# Patient Record
Sex: Female | Born: 1980 | Race: Black or African American | Hispanic: No | Marital: Married | Smoking: Never smoker
Health system: Southern US, Community
[De-identification: ages and names within clinical notes are randomized; demographics above are authoritative.]

## PROBLEM LIST (undated history)

## (undated) DIAGNOSIS — D219 Benign neoplasm of connective and other soft tissue, unspecified: Secondary | ICD-10-CM

## (undated) DIAGNOSIS — T7840XA Allergy, unspecified, initial encounter: Secondary | ICD-10-CM

## (undated) DIAGNOSIS — B019 Varicella without complication: Secondary | ICD-10-CM

## (undated) DIAGNOSIS — E785 Hyperlipidemia, unspecified: Secondary | ICD-10-CM

## (undated) DIAGNOSIS — G44229 Chronic tension-type headache, not intractable: Secondary | ICD-10-CM

## (undated) DIAGNOSIS — L732 Hidradenitis suppurativa: Secondary | ICD-10-CM

## (undated) HISTORY — DX: Allergy, unspecified, initial encounter: T78.40XA

## (undated) HISTORY — PX: OTHER SURGICAL HISTORY: SHX169

## (undated) HISTORY — DX: Hyperlipidemia, unspecified: E78.5

## (undated) HISTORY — DX: Hidradenitis suppurativa: L73.2

## (undated) HISTORY — DX: Chronic tension-type headache, not intractable: G44.229

## (undated) HISTORY — DX: Benign neoplasm of connective and other soft tissue, unspecified: D21.9

## (undated) HISTORY — DX: Varicella without complication: B01.9

---

## 2010-07-09 ENCOUNTER — Ambulatory Visit (INDEPENDENT_AMBULATORY_CARE_PROVIDER_SITE_OTHER): Payer: BC Managed Care – PPO | Admitting: Internal Medicine

## 2010-07-09 ENCOUNTER — Encounter: Payer: Self-pay | Admitting: Internal Medicine

## 2010-07-09 DIAGNOSIS — E7800 Pure hypercholesterolemia, unspecified: Secondary | ICD-10-CM

## 2010-07-09 DIAGNOSIS — J309 Allergic rhinitis, unspecified: Secondary | ICD-10-CM | POA: Insufficient documentation

## 2010-07-09 DIAGNOSIS — Z79899 Other long term (current) drug therapy: Secondary | ICD-10-CM

## 2010-07-09 DIAGNOSIS — L309 Dermatitis, unspecified: Secondary | ICD-10-CM

## 2010-07-09 DIAGNOSIS — J45909 Unspecified asthma, uncomplicated: Secondary | ICD-10-CM

## 2010-07-09 DIAGNOSIS — L259 Unspecified contact dermatitis, unspecified cause: Secondary | ICD-10-CM

## 2010-07-09 DIAGNOSIS — E785 Hyperlipidemia, unspecified: Secondary | ICD-10-CM

## 2010-07-09 DIAGNOSIS — L732 Hidradenitis suppurativa: Secondary | ICD-10-CM

## 2010-07-09 DIAGNOSIS — Z13220 Encounter for screening for lipoid disorders: Secondary | ICD-10-CM

## 2010-07-09 LAB — BASIC METABOLIC PANEL
CO2: 29 mEq/L (ref 19–32)
Chloride: 100 mEq/L (ref 96–112)
GFR: 90.87 mL/min (ref >60.00)
Glucose, Bld: 82 mg/dL (ref 70–99)
Potassium: 5 mEq/L (ref 3.5–5.1)
Sodium: 138 mEq/L (ref 135–145)

## 2010-07-09 LAB — CBC WITH DIFFERENTIAL/PLATELET
Basophils Relative: 0.5 % (ref 0.0–3.0)
Eosinophils Absolute: 0.1 10*3/uL (ref 0.0–0.7)
HCT: 37.4 % (ref 36.0–46.0)
Hemoglobin: 12.2 g/dL (ref 12.0–15.0)
Lymphocytes Relative: 40.6 % (ref 12.0–46.0)
Lymphs Abs: 1.8 10*3/uL (ref 0.7–4.0)
MCHC: 32.7 g/dL (ref 30.0–36.0)
Neutro Abs: 2.2 10*3/uL (ref 1.4–7.7)
RBC: 4.5 Mil/uL (ref 3.87–5.11)

## 2010-07-09 LAB — LIPID PANEL
HDL: 71.1 mg/dL (ref >39.00)
Total CHOL/HDL Ratio: 4
Triglycerides: 42 mg/dL (ref 0.0–149.0)
VLDL: 8.4 mg/dL (ref 0.0–40.0)

## 2010-07-09 LAB — HEPATIC FUNCTION PANEL
ALT: 24 U/L (ref 0–35)
Bilirubin, Direct: 0 mg/dL (ref 0.0–0.3)
Total Bilirubin: 0.8 mg/dL (ref 0.3–1.2)

## 2010-07-09 NOTE — Progress Notes (Signed)
  Subjective:    Patient ID: Tammy Rose, female    DOB: May 06, 1980, 30 y.o.   MRN: [id number]  HPI patient presents to clinic to establish primary medical care and for followup of hyperlipidemia. In the past has been told had mildly elevated cholesterol but did not require medication. No known significant family history of hyperlipidemia. Temperature family history of heart disease or personal history of stroke or heart disease. Has history chronic eczema which she uses a steroid cream on when necessary basis. Hasn't side effects and does not use it consistently enough to develop thinning of the skin. 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 Sees dermatology periodically for this and feels is under adequate control currently. Suffers from allergic rhinitis status post for partial course of allergy shots. Currently uses Zyrtec when necessary with adequate control. No sedation with Zyrtec. Tetanus shot up-to-date 2004. Uses albuterol MDI for asthma with no recent flares. Pap smear is up-to-date through gynecology and have reportedly been normal. No other complaints. 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  Review of past medical history, past surgical history, medications, allergies, social history and family history  Review of Systems  Constitutional: Negative for fever, chills and fatigue.  HENT: Negative for hearing loss, congestion and rhinorrhea.   Skin: Negative for color change, rash and wound.  [all other systems reviewed and are negative       Objective:   Physical Exam    Physical Exam  [nursing notereviewed. Constitutional:  appears well-developed and well-nourished. No distress.  HENT:  Head: Normocephalic and atraumatic.  Right Ear: Tympanic membrane, external ear and ear canal normal.  Left Ear: Tympanic membrane, external ear and ear canal normal.    Nose: Nose normal.  Mouth/Throat: Oropharynx is clear and moist. No oropharyngeal exudate.  Eyes: Conjunctivae are normal. No scleral icterus.  Neck: Neck supple.  Cardiovascular: Normal rate, regular rhythm and normal heart sounds.  Exam reveals no gallop and no friction rub.   No murmur heard. Pulmonary/Chest: Effort normal and breath sounds normal. No respiratory distress.  no wheezes.  no rales.  Abdomen: Soft, nondistended, nontender to palpation, positive bowel sounds. No masses or organomegaly noted Lymphadenopathy:     no cervical adenopathy.  Neurological:  alert.  Skin: Skin is warm and dry.  not diaphoretic.      Assessment & Plan:

## 2010-07-09 NOTE — Assessment & Plan Note (Signed)
Obtain fasting lipid profile and liver function tests. 

## 2010-07-09 NOTE — Assessment & Plan Note (Signed)
Stable without recent flare. Continue albuterol inhaler when necessary

## 2010-07-09 NOTE — Assessment & Plan Note (Signed)
Stable currently. No recent exacerbation. Continued followup with dermatology.

## 2010-07-09 NOTE — Assessment & Plan Note (Signed)
Stable. Refill steroid cream for when necessary use

## 2010-07-17 ENCOUNTER — Telehealth: Payer: Self-pay

## 2010-07-17 NOTE — Telephone Encounter (Signed)
Message copied by Kyung Rudd on Fri Jul 17, 2010  3:13 PM ------      Message from: Letitia Libra, Maisie Fus      Created: Fri Jul 17, 2010  9:01 AM       Total chol and ldl chol are definitely elevated. Don't recommend medication at this time though. Low fact diet, regular exercise and wt loss. Also oats/oatmeal may help. Recommend rechecking in ~91months. Other labs nl

## 2010-07-17 NOTE — Telephone Encounter (Signed)
Pt aware. Pt has a question...she and her husband are planning to try getting pregnant starting now so pt wants to know if she should get the cholesterol under control and loss some weight before trying for the baby?

## 2010-07-17 NOTE — Telephone Encounter (Signed)
Per, Dr. Rodena Medin, for health reasons pt should try weight loss before pregnancy, for it will be easier on pt during pregnancy and delivery. Left detailed message to notify pt

## 2010-07-17 NOTE — Progress Notes (Signed)
Pt aware.

## 2010-08-26 LAB — RPR: RPR: NONREACTIVE

## 2010-08-26 LAB — HEPATITIS B SURFACE ANTIGEN: Hepatitis B Surface Ag: NEGATIVE

## 2010-08-26 LAB — ABO/RH

## 2010-08-26 LAB — RUBELLA ANTIBODY, IGM: Rubella: IMMUNE

## 2010-08-26 LAB — HIV ANTIBODY (ROUTINE TESTING W REFLEX): HIV: NONREACTIVE

## 2011-02-26 LAB — STREP B DNA PROBE: GBS: NEGATIVE

## 2011-03-09 NOTE — L&D Delivery Note (Signed)
Delivery Note At 3:14 PM a viable and healthy female was delivered via Vaginal, Spontaneous Delivery (Presentation:LOA ).  APGAR: 9,9 ; weight 7 lb 3.2 oz (3265 g).   Placenta status: Intact, Spontaneous.  Cord: 3 vessels with the following complications: None.  Cord pH: na  Anesthesia: Epidural  Episiotomy: None Lacerations: 2nd degree Suture Repair: 2.0 vicryl rapide Est. Blood Loss (mL): 300  Mom to postpartum.  Baby to nursery-stable.  Thena Devora J 03/27/2011, 3:38 PM

## 2011-03-19 ENCOUNTER — Encounter (HOSPITAL_COMMUNITY): Payer: Self-pay | Admitting: *Deleted

## 2011-03-19 ENCOUNTER — Telehealth (HOSPITAL_COMMUNITY): Payer: Self-pay | Admitting: *Deleted

## 2011-03-19 NOTE — Telephone Encounter (Signed)
Preadmission screen  

## 2011-03-25 ENCOUNTER — Telehealth (HOSPITAL_COMMUNITY): Payer: Self-pay | Admitting: *Deleted

## 2011-03-25 NOTE — Telephone Encounter (Signed)
Preadmission screen  

## 2011-03-27 ENCOUNTER — Encounter (HOSPITAL_COMMUNITY): Payer: Self-pay

## 2011-03-27 ENCOUNTER — Inpatient Hospital Stay (HOSPITAL_COMMUNITY): Payer: BC Managed Care – PPO | Admitting: Anesthesiology

## 2011-03-27 ENCOUNTER — Encounter (HOSPITAL_COMMUNITY): Payer: Self-pay | Admitting: Anesthesiology

## 2011-03-27 ENCOUNTER — Inpatient Hospital Stay (HOSPITAL_COMMUNITY)
Admission: RE | Admit: 2011-03-27 | Discharge: 2011-03-29 | DRG: 373 | Disposition: A | Discharge status: Home / Self Care | Payer: BC Managed Care – PPO | Source: Ambulatory Visit | Attending: Obstetrics and Gynecology | Admitting: Obstetrics and Gynecology

## 2011-03-27 DIAGNOSIS — L732 Hidradenitis suppurativa: Secondary | ICD-10-CM

## 2011-03-27 DIAGNOSIS — O4100X0 Oligohydramnios, unspecified trimester, not applicable or unspecified: Principal | ICD-10-CM | POA: Diagnosis present

## 2011-03-27 DIAGNOSIS — IMO0001 Reserved for inherently not codable concepts without codable children: Secondary | ICD-10-CM

## 2011-03-27 LAB — RPR: RPR Ser Ql: NONREACTIVE

## 2011-03-27 LAB — CBC
MCH: 27 pg (ref 26.0–34.0)
MCV: 83.9 fL (ref 78.0–100.0)
Platelets: 166 10*3/uL (ref 150–400)
RBC: 4.15 MIL/uL (ref 3.87–5.11)
RDW: 14.8 % (ref 11.5–15.5)
WBC: 5.4 10*3/uL (ref 4.0–10.5)

## 2011-03-27 MED ORDER — WITCH HAZEL-GLYCERIN EX PADS: 1.0000 "application " | MEDICATED_PAD | CUTANEOUS | Status: DC | PRN

## 2011-03-27 MED ORDER — LIDOCAINE HCL (PF) 1 % IJ SOLN
30.0000 mL | INTRAMUSCULAR | Status: DC | PRN
  Filled 2011-03-27: qty 30

## 2011-03-27 MED ORDER — LACTATED RINGERS IV SOLN
500.0000 mL | INTRAVENOUS | Status: DC | PRN
  Administered 2011-03-27 (×2): 1000 mL via INTRAVENOUS

## 2011-03-27 MED ORDER — ZOLPIDEM TARTRATE 5 MG PO TABS: 5.0000 mg | ORAL_TABLET | Freq: Every evening | ORAL | Status: DC | PRN

## 2011-03-27 MED ORDER — LIDOCAINE HCL 1.5 % IJ SOLN
INTRAMUSCULAR | Status: DC | PRN
  Administered 2011-03-27 (×2): 5 mL via EPIDURAL

## 2011-03-27 MED ORDER — FLEET ENEMA 7-19 GM/118ML RE ENEM: 1.0000 | ENEMA | RECTAL | Status: DC | PRN

## 2011-03-27 MED ORDER — EPHEDRINE 5 MG/ML INJ
10.0000 mg | INTRAVENOUS | Status: DC | PRN
  Filled 2011-03-27: qty 4

## 2011-03-27 MED ORDER — LACTATED RINGERS IV SOLN
INTRAVENOUS | Status: DC
  Administered 2011-03-27: 08:00:00 via INTRAVENOUS

## 2011-03-27 MED ORDER — CITRIC ACID-SODIUM CITRATE 334-500 MG/5ML PO SOLN: 30.0000 mL | ORAL | Status: DC | PRN

## 2011-03-27 MED ORDER — ONDANSETRON HCL 4 MG PO TABS: 4.0000 mg | ORAL_TABLET | ORAL | Status: DC | PRN

## 2011-03-27 MED ORDER — PHENYLEPHRINE 40 MCG/ML (10ML) SYRINGE FOR IV PUSH (FOR BLOOD PRESSURE SUPPORT): 80.0000 ug | PREFILLED_SYRINGE | INTRAVENOUS | Status: DC | PRN

## 2011-03-27 MED ORDER — DIPHENHYDRAMINE HCL 25 MG PO CAPS: 25.0000 mg | ORAL_CAPSULE | Freq: Four times a day (QID) | ORAL | Status: DC | PRN

## 2011-03-27 MED ORDER — BENZOCAINE-MENTHOL 20-0.5 % EX AERO
1.0000 "application " | INHALATION_SPRAY | CUTANEOUS | Status: DC | PRN
  Administered 2011-03-28: 1 via TOPICAL

## 2011-03-27 MED ORDER — OXYCODONE-ACETAMINOPHEN 5-325 MG PO TABS: 2.0000 | ORAL_TABLET | ORAL | Status: DC | PRN

## 2011-03-27 MED ORDER — LANOLIN HYDROUS EX OINT: TOPICAL_OINTMENT | CUTANEOUS | Status: DC | PRN

## 2011-03-27 MED ORDER — METHYLERGONOVINE MALEATE 0.2 MG PO TABS: 0.2000 mg | ORAL_TABLET | ORAL | Status: DC | PRN

## 2011-03-27 MED ORDER — OXYTOCIN 20 UNITS IN LACTATED RINGERS INFUSION - SIMPLE: 125.0000 mL/h | INTRAVENOUS | Status: DC

## 2011-03-27 MED ORDER — DIPHENHYDRAMINE HCL 50 MG/ML IJ SOLN: 12.5000 mg | INTRAMUSCULAR | Status: DC | PRN

## 2011-03-27 MED ORDER — IBUPROFEN 600 MG PO TABS
600.0000 mg | ORAL_TABLET | Freq: Four times a day (QID) | ORAL | Status: DC
  Administered 2011-03-27 – 2011-03-29 (×6): 600 mg via ORAL
  Filled 2011-03-27 (×7): qty 1

## 2011-03-27 MED ORDER — OXYTOCIN 20 UNITS IN LACTATED RINGERS INFUSION - SIMPLE
1.0000 m[IU]/min | INTRAVENOUS | Status: DC
  Administered 2011-03-27: 2 m[IU]/min via INTRAVENOUS
  Filled 2011-03-27: qty 1000

## 2011-03-27 MED ORDER — LACTATED RINGERS IV SOLN: 500.0000 mL | Freq: Once | INTRAVENOUS | Status: DC

## 2011-03-27 MED ORDER — OXYTOCIN BOLUS FROM INFUSION
500.0000 mL | Freq: Once | INTRAVENOUS | Status: DC
  Filled 2011-03-27: qty 500

## 2011-03-27 MED ORDER — PHENYLEPHRINE 40 MCG/ML (10ML) SYRINGE FOR IV PUSH (FOR BLOOD PRESSURE SUPPORT)
80.0000 ug | PREFILLED_SYRINGE | INTRAVENOUS | Status: DC | PRN
  Filled 2011-03-27: qty 5

## 2011-03-27 MED ORDER — PRENATAL MULTIVITAMIN CH
1.0000 | ORAL_TABLET | Freq: Every day | ORAL | Status: DC
  Administered 2011-03-28 – 2011-03-29 (×2): 1 via ORAL
  Filled 2011-03-27: qty 1

## 2011-03-27 MED ORDER — ONDANSETRON HCL 4 MG/2ML IJ SOLN: 4.0000 mg | INTRAMUSCULAR | Status: DC | PRN

## 2011-03-27 MED ORDER — OXYTOCIN 20 UNITS IN LACTATED RINGERS INFUSION - SIMPLE: 125.0000 mL/h | Freq: Once | INTRAVENOUS | Status: DC

## 2011-03-27 MED ORDER — ONDANSETRON HCL 4 MG/2ML IJ SOLN: 4.0000 mg | Freq: Four times a day (QID) | INTRAMUSCULAR | Status: DC | PRN

## 2011-03-27 MED ORDER — OXYCODONE-ACETAMINOPHEN 5-325 MG PO TABS: 1.0000 | ORAL_TABLET | ORAL | Status: DC | PRN

## 2011-03-27 MED ORDER — FENTANYL 2.5 MCG/ML BUPIVACAINE 1/10 % EPIDURAL INFUSION (WH - ANES)
INTRAMUSCULAR | Status: DC | PRN
  Administered 2011-03-27: 14 mL/h via EPIDURAL

## 2011-03-27 MED ORDER — ACETAMINOPHEN 325 MG PO TABS: 650.0000 mg | ORAL_TABLET | ORAL | Status: DC | PRN

## 2011-03-27 MED ORDER — TETANUS-DIPHTH-ACELL PERTUSSIS 5-2.5-18.5 LF-MCG/0.5 IM SUSP: 0.5000 mL | Freq: Once | INTRAMUSCULAR | Status: DC

## 2011-03-27 MED ORDER — METHYLERGONOVINE MALEATE 0.2 MG/ML IJ SOLN: 0.2000 mg | INTRAMUSCULAR | Status: DC | PRN

## 2011-03-27 MED ORDER — IBUPROFEN 600 MG PO TABS: 600.0000 mg | ORAL_TABLET | Freq: Four times a day (QID) | ORAL | Status: DC | PRN

## 2011-03-27 MED ORDER — ZOLPIDEM TARTRATE 10 MG PO TABS: 10.0000 mg | ORAL_TABLET | Freq: Every evening | ORAL | Status: DC | PRN

## 2011-03-27 MED ORDER — FENTANYL 2.5 MCG/ML BUPIVACAINE 1/10 % EPIDURAL INFUSION (WH - ANES)
14.0000 mL/h | INTRAMUSCULAR | Status: DC
  Administered 2011-03-27: 14 mL/h via EPIDURAL
  Filled 2011-03-27 (×2): qty 60

## 2011-03-27 MED ORDER — EPHEDRINE 5 MG/ML INJ: 10.0000 mg | INTRAVENOUS | Status: DC | PRN

## 2011-03-27 MED ORDER — DIBUCAINE 1 % RE OINT: 1.0000 "application " | TOPICAL_OINTMENT | RECTAL | Status: DC | PRN

## 2011-03-27 MED ORDER — SENNOSIDES-DOCUSATE SODIUM 8.6-50 MG PO TABS
2.0000 | ORAL_TABLET | Freq: Every day | ORAL | Status: DC
  Administered 2011-03-27 – 2011-03-28 (×2): 2 via ORAL

## 2011-03-27 MED ORDER — SIMETHICONE 80 MG PO CHEW: 80.0000 mg | CHEWABLE_TABLET | ORAL | Status: DC | PRN

## 2011-03-27 MED ORDER — TERBUTALINE SULFATE 1 MG/ML IJ SOLN: 0.2500 mg | Freq: Once | INTRAMUSCULAR | Status: DC | PRN

## 2011-03-27 NOTE — H&P (Signed)
NAME:  Tammy Rose, Tammy Rose               ACCOUNT NO.:  [id number]  MEDICAL RECORD NO.:  [id number]  LOCATION:  9160                          FACILITY:  [facility]  PHYSICIAN:  Lenoard Aden, M.D.DATE OF BIRTH:  1980-08-30  DATE OF ADMISSION:  03/27/2011 DATE OF DISCHARGE:                             HISTORY & PHYSICAL   CHIEF COMPLAINT:  Oligohydramnios at 39 weeks for induction.  HISTORY OF PRESENT ILLNESS:  She is a 31 year old African American female, G2, P1 at 39-1/7th weeks gestation who presents for induction of labor due to persistent oligohydramnios and an AFI of 7 with a history of IUGR in her previous pregnancy, favorable cervix for induction.  Past medical history is remarkable for allergies to SHELLFISH and NUTS.  She has a personal history of asthma.  She is a nonsmoker and nondrinker. She denies domestic or physical violence.  She has a history of an IUGR fetus at 5 pounds 14 ounces born in 2009.  She has a family history of rheumatoid arthritis, diabetes, migraine headaches, heart disease, cervical cancer, hypertension, and bladder cancer.  Prenatal course complicated only by oligohydramnios.  PHYSICAL EXAMINATION:  GENERAL:  She is a well-developed, well- nourished, white female, in no acute distress. HEENT:  Normal. NECK:  Supple.  Full range of motion. LUNGS:  Clear. HEART:  Regular rhythm. ABDOMEN:  Soft, gravid, nontender. PELVIC:  Estimated fetal weight 6-1/2 to 7 pounds.  Cervix is 2-3 cm, 70%, vertex, -1. EXTREMITIES:  No cords. NEUROLOGIC:  Nonfocal. SKIN:  Intact.  IMPRESSION: 1. Term intrauterine pregnancy at 39 weeks with persistent     oligohydramnios. 2. History of intrauterine growth restriction and previous gestation.  PLAN:  Proceed with Pitocin induction.  Risks of anesthesia, infection, bleeding, discussed.  Possible increased risk of C-section noted.  The patient acknowledges and will proceed.     Lenoard Aden, M.D.    RJT/MEDQ  D:   03/27/2011  T:  03/27/2011  Job:  [id number]

## 2011-03-27 NOTE — Anesthesia Postprocedure Evaluation (Signed)
Anesthesia Post Note  Patient: Tammy Rose  Procedure(s) Performed: * No procedures listed *  Anesthesia type: Epidural  Patient location: Mother/Baby  Post pain: Pain level controlled  Post assessment: Post-op Vital signs reviewed  Last Vitals:  Filed Vitals:   03/27/11 1615  BP: 116/79  Pulse: 110  Temp:   Resp: 20    Post vital signs: Reviewed  Level of consciousness: awake  Complications: No apparent anesthesia complications

## 2011-03-27 NOTE — Progress Notes (Signed)
Dr.taavon notified that pt is 9cm and feeling some pressure.

## 2011-03-27 NOTE — Progress Notes (Signed)
In and out cath without difficulty for of clear yellow urine.

## 2011-03-27 NOTE — Addendum Note (Signed)
Addendum  created 03/27/11 1920 by Len Blalock, CRNA   Modules edited:Notes Section

## 2011-03-27 NOTE — Anesthesia Procedure Notes (Signed)
Epidural Patient location during procedure: OB Start time: 03/27/2011 11:10 AM End time: 03/27/2011 11:16 AM Reason for block: procedure for pain  Staffing Anesthesiologist: Sandrea Hughs Performed by: anesthesiologist   Preanesthetic Checklist Completed: patient identified, site marked, surgical consent, pre-op evaluation, timeout performed, IV checked, risks and benefits discussed and monitors and equipment checked  Epidural Patient position: sitting Prep: site prepped and draped and DuraPrep Patient monitoring: continuous pulse ox and blood pressure Approach: midline Injection technique: LOR air  Needle:  Needle type: Tuohy  Needle gauge: 17 G Needle length: 9 cm Needle insertion depth: 7 cm Catheter type: closed end flexible Catheter size: 19 Gauge Catheter at skin depth: 12 cm Test dose: negative and 1.5% lidocaine  Assessment Sensory level: T8 Events: blood not aspirated, injection not painful, no injection resistance, negative IV test and no paresthesia

## 2011-03-27 NOTE — Anesthesia Postprocedure Evaluation (Signed)
  Anesthesia Post-op Note  Patient: Tammy Rose  Procedure(s) Performed: * No procedures listed *  Patient Location: PACU and Mother/Baby  Anesthesia Type: Epidural  Level of Consciousness: awake, alert  and oriented  Airway and Oxygen Therapy: Patient Spontanous Breathing   Post-op Assessment: Patient's Cardiovascular Status Stable and Respiratory Function Stable  Post-op Vital Signs: stable  Complications: No apparent anesthesia complications

## 2011-03-27 NOTE — Progress Notes (Signed)
Tammy Rose is a 31 y.o. G2P1001 at [redacted]w[redacted]d by LMP admitted for induction of labor due to Low amniotic fluid..  Subjective: no complaints   Objective: BP 114/55  Pulse 76  Temp(Src) 98.1 F (36.7 C) (Oral)  Resp 20  Ht 5\' 2"  (1.575 m)  Wt 90.266 kg (199 lb)  BMI 36.40 kg/m2  LMP 06/26/2010      FHT:  FHR: 155 bpm, variability: moderate,  accelerations:  Present,  decelerations:  Absent UC:   irregular, every 5-7 minutes SVE:   Dilation: 3.5 Effacement (%): 80 Station: -1 Exam by:: dr. Billy Coast AROM- clear  Labs: Lab Results  Component Value Date   WBC 5.4 03/27/2011   HGB 11.2* 03/27/2011   HCT 34.8* 03/27/2011   MCV 83.9 03/27/2011   PLT 166 03/27/2011    Assessment / Plan: Induction of labor due to oligohydramnios,  progressing well on pitocin  Labor: Progressing normally Preeclampsia:  na Fetal Wellbeing:  Category I Pain Control:  Labor support without medications I/D:  n/a Anticipated MOD:  NSVD  [name] J 03/27/2011, 9:57 AM

## 2011-03-27 NOTE — Anesthesia Preprocedure Evaluation (Signed)
Anesthesia Evaluation  Patient identified by MRN, date of birth, ID band Patient awake    Reviewed: Allergy & Precautions, H&P , NPO status , Patient's Chart, lab work & pertinent test results  Airway Mallampati: II TM Distance: >3 FB Neck ROM: full    Dental No notable dental hx.    Pulmonary    Pulmonary exam normal       Cardiovascular neg cardio ROS     Neuro/Psych Negative Neurological ROS  Negative Psych ROS   GI/Hepatic negative GI ROS, Neg liver ROS,   Endo/Other  Morbid obesity  Renal/GU negative Renal ROS  Genitourinary negative   Musculoskeletal negative musculoskeletal ROS (+)   Abdominal (+) obese,   Peds negative pediatric ROS (+)  Hematology negative hematology ROS (+)   Anesthesia Other Findings   Reproductive/Obstetrics (+) Pregnancy                           Anesthesia Physical Anesthesia Plan  ASA: III  Anesthesia Plan: Epidural   Post-op Pain Management:    Induction:   Airway Management Planned:   Additional Equipment:   Intra-op Plan:   Post-operative Plan:   Informed Consent: I have reviewed the patients History and Physical, chart, labs and discussed the procedure including the risks, benefits and alternatives for the proposed anesthesia with the patient or authorized representative who has indicated his/her understanding and acceptance.     Plan Discussed with:   Anesthesia Plan Comments:         Anesthesia Quick Evaluation  

## 2011-03-27 NOTE — Progress Notes (Signed)
Dr. Billy Coast notified that pt is complete and pushing at a plus one station.

## 2011-03-27 NOTE — Progress Notes (Signed)
Spontaneous del of viable female infant. apgars 9/9. 

## 2011-03-28 ENCOUNTER — Encounter (HOSPITAL_COMMUNITY): Payer: Self-pay

## 2011-03-28 LAB — CBC
MCH: 27.2 pg (ref 26.0–34.0)
MCHC: 32.6 g/dL (ref 30.0–36.0)
MCV: 83.4 fL (ref 78.0–100.0)
Platelets: 157 10*3/uL (ref 150–400)
RDW: 14.8 % (ref 11.5–15.5)

## 2011-03-28 MED ORDER — CLINDAMYCIN HCL 300 MG PO CAPS
300.0000 mg | ORAL_CAPSULE | Freq: Two times a day (BID) | ORAL | Status: DC
  Administered 2011-03-28 – 2011-03-29 (×2): 300 mg via ORAL
  Filled 2011-03-28 (×2): qty 1

## 2011-03-28 MED ORDER — BENZOCAINE-MENTHOL 20-0.5 % EX AERO
INHALATION_SPRAY | CUTANEOUS | Status: AC
  Administered 2011-03-28: 04:00:00
  Filled 2011-03-28: qty 56

## 2011-03-28 NOTE — Progress Notes (Addendum)
PPD 1 SVD  S:  Reports feeling well, sore bottom.             Tolerating po/ No nausea or vomiting             Bleeding is moderate             Pain controlled with Motrin.             Up ad lib / ambulatory  Hx hidradenitis lesions in axilla, tx by derm w/ steroids and / or ABX topical   Newborn  Information for the patient's newborn:  Tammy Rose, Schlichting [[id number]]  female  breast feeding  / Circumcision - plans bris   O:  A & O x 3 NAD             VS: Blood pressure 102/71, pulse 63, temperature 98.2 F (36.8 C), temperature source Oral, resp. rate 16, height 5\' 2"  (1.575 m), weight 90.266 kg (199 lb), last menstrual period 06/26/2010, SpO2 96.00%, unknown if currently breastfeeding.  LABS:  Basename 03/28/11 0500 03/27/11 0743  HGB 10.5* 11.2*  HCT 32.2* 34.8*    I&O: I/O last 3 completed shifts: In: -  Out: 1750 [Urine:1150; Blood:600]  Skin: active hidradenitis lesions in R axila    Lungs: Clear and unlabored  Heart: regular rate and rhythm / no mumurs  Abdomen: soft, non-tender, non-distended              Fundus: firm, non-tender, @U   Perineum: repair intact, mild edema  Lochia: moderate  Extremities: no edema, no calf pain or tenderness, neg Homans    A/P: PPD # 1 30 y.o., [id number] S/P:induced vaginal, oligo   Principal Problem:  *PP care - s/p SVD 1/19   Doing well - stable status  Routine post partum orders Hidradenitis - active lesions  Clindamycin 300 mg PO BID   PAUL,DANIELA, CNM, MSN 03/28/2011, 11:40 AM

## 2011-03-29 MED ORDER — CLINDAMYCIN HCL 300 MG PO CAPS: 300.0000 mg | ORAL_CAPSULE | Freq: Two times a day (BID) | ORAL | Status: AC

## 2011-03-29 MED ORDER — IBUPROFEN 600 MG PO TABS: 600.0000 mg | ORAL_TABLET | Freq: Four times a day (QID) | ORAL | Status: AC

## 2011-03-29 MED ORDER — PRENATAL MULTIVITAMIN CH: 1.0000 | ORAL_TABLET | Freq: Every day | ORAL | Status: DC

## 2011-03-29 NOTE — Progress Notes (Signed)
Post Partum Day #2            Information for the patient's newborn:  Namine, Beahm [[id number]]  female   / circumcision declined / plans bris Feeding: breast  Subjective: No HA, SOB, CP, F/C, breast symptoms. Pain controlled. Normal vaginal bleeding, no clots.      Objective:  Temp:  [97.5 F (36.4 C)-98.1 F (36.7 C)] 98 F (36.7 C) (01/21 0541) Pulse Rate:  [60-71] 62  (01/21 0541) Resp:  [18] 18  (01/21 0541) BP: (98-112)/(63-72) 112/72 mmHg (01/21 0541)  No intake or output data in the 24 hours ending 03/29/11 1027     Basename 03/28/11 0500 03/27/11 0743  WBC 8.4 5.4  HGB 10.5* 11.2*  HCT 32.2* 34.8*  PLT 157 166    Blood type: --/--/O POS (01/19 7846) Rubella: Immune (06/20 0000)    Physical Exam:  General: alert, cooperative and no distress Uterine Fundus: firm Lochia: appropriate Perineum: repair intact, edema none DVT Evaluation: Negative Homan's sign. No significant calf/ankle edema.    Assessment/Plan: PPD # 2 / 31 y.o., [id number] S/P:spontaneous vaginal   Principal Problem:  *PP care - s/p SVD 1/19    normal postpartum exam  Continue current postpartum care  D/C home  Hidradenitis - active lesions  Clindamycin 300 mg PO BID x 8 wks  Oral probiotic 2 caps daily while on ABX.  LOS: 2 days   Larry Knipp, CNM, MSN 03/29/2011, 10:27 AM

## 2011-03-29 NOTE — Discharge Summary (Signed)
Obstetric Discharge Summary Reason for Admission: induction of labor and oligo Prenatal Procedures: ultrasound Intrapartum Procedures: spontaneous vaginal delivery Postpartum Procedures: none Complications-Operative and Postpartum: 2nd degree perineal laceration Hemoglobin  Date Value Range Status  03/28/2011 10.5* 12.0-15.0 (g/dL) Final     HCT  Date Value Range Status  03/28/2011 32.2* 36.0-46.0 (%) Final    Discharge Diagnoses:  Term Pregnancy-delivered Hidradenitis, active lesions  Discharge Information: Date: 03/29/2011 Activity: pelvic rest Diet: routine Medications: PNV, Ibuprofen and Clindamycin Condition: stable Instructions: refer to practice specific booklet Discharge to: home Follow-up Information    Follow up with Lenoard Aden, MD in 6 weeks.   Contact information:   [address] [place] [address] [phone]          Newborn Data: Live born female  Birth Weight: 7 lb 3.2 oz (3265 g) APGAR: 9, 9  Home with mother.  PAUL,DANIELA 03/29/2011, 12:59 PM

## 2011-04-02 ENCOUNTER — Inpatient Hospital Stay (HOSPITAL_COMMUNITY): Admission: AD | Admit: 2011-04-02 | Payer: Self-pay | Source: Ambulatory Visit | Admitting: Obstetrics and Gynecology

## 2014-01-07 ENCOUNTER — Encounter (HOSPITAL_COMMUNITY): Payer: Self-pay

## 2015-01-20 ENCOUNTER — Encounter: Payer: Self-pay | Admitting: Family Medicine

## 2015-01-20 ENCOUNTER — Ambulatory Visit (INDEPENDENT_AMBULATORY_CARE_PROVIDER_SITE_OTHER): Payer: BLUE CROSS/BLUE SHIELD | Admitting: Family Medicine

## 2015-01-20 VITALS — BP 100/64 | HR 92 | Temp 98.2°F | Wt 182.0 lb

## 2015-01-20 DIAGNOSIS — T783XXA Angioneurotic edema, initial encounter: Secondary | ICD-10-CM

## 2015-01-20 DIAGNOSIS — R5383 Other fatigue: Secondary | ICD-10-CM

## 2015-01-20 DIAGNOSIS — Z91018 Allergy to other foods: Secondary | ICD-10-CM | POA: Diagnosis not present

## 2015-01-20 DIAGNOSIS — R6889 Other general symptoms and signs: Secondary | ICD-10-CM

## 2015-01-20 DIAGNOSIS — R635 Abnormal weight gain: Secondary | ICD-10-CM

## 2015-01-20 LAB — CBC WITH DIFFERENTIAL/PLATELET
BASOS ABS: 0 10*3/uL (ref 0.0–0.1)
Basophils Relative: 0.5 % (ref 0.0–3.0)
EOS ABS: 0.1 10*3/uL (ref 0.0–0.7)
Eosinophils Relative: 0.8 % (ref 0.0–5.0)
HEMATOCRIT: 39.8 % (ref 36.0–46.0)
Hemoglobin: 12.8 g/dL (ref 12.0–15.0)
LYMPHS PCT: 21.8 % (ref 12.0–46.0)
Lymphs Abs: 2.1 10*3/uL (ref 0.7–4.0)
MCHC: 32 g/dL (ref 30.0–36.0)
MCV: 81.5 fl (ref 78.0–100.0)
MONOS PCT: 5.8 % (ref 3.0–12.0)
Monocytes Absolute: 0.5 10*3/uL (ref 0.1–1.0)
Neutro Abs: 6.7 10*3/uL (ref 1.4–7.7)
Neutrophils Relative %: 71.1 % (ref 43.0–77.0)
Platelets: 371 10*3/uL (ref 150.0–400.0)
RBC: 4.89 Mil/uL (ref 3.87–5.11)
RDW: 14.2 % (ref 11.5–15.5)
WBC: 9.5 10*3/uL (ref 4.0–10.5)

## 2015-01-20 LAB — COMPREHENSIVE METABOLIC PANEL
ALK PHOS: 93 U/L (ref 39–117)
ALT: 29 U/L (ref 0–35)
AST: 26 U/L (ref 0–37)
Albumin: 4.3 g/dL (ref 3.5–5.2)
BILIRUBIN TOTAL: 0.3 mg/dL (ref 0.2–1.2)
BUN: 14 mg/dL (ref 6–23)
CALCIUM: 9.8 mg/dL (ref 8.4–10.5)
CO2: 31 meq/L (ref 19–32)
CREATININE: 0.92 mg/dL (ref 0.40–1.20)
Chloride: 100 mEq/L (ref 96–112)
GFR: 88.48 mL/min (ref >60.00)
GLUCOSE: 87 mg/dL (ref 70–99)
POTASSIUM: 4.7 meq/L (ref 3.5–5.1)
Sodium: 139 mEq/L (ref 135–145)
TOTAL PROTEIN: 8.2 g/dL (ref 6.0–8.3)

## 2015-01-20 LAB — HEMOGLOBIN A1C: Hgb A1c MFr Bld: 5.8 % (ref 4.6–6.5)

## 2015-01-20 LAB — T3, FREE: T3 FREE: 3 pg/mL (ref 2.3–4.2)

## 2015-01-20 LAB — RHEUMATOID FACTOR

## 2015-01-20 LAB — TSH: TSH: 1.28 u[IU]/mL (ref 0.35–4.50)

## 2015-01-20 LAB — T4, FREE: FREE T4: 0.65 ng/dL (ref 0.60–1.60)

## 2015-01-20 NOTE — Progress Notes (Signed)
Tammy Reddish, MD Phone: [phone]  Subjective:  Patient presents today to establish care. Chief complaint-noted.   See problem oriented charting  The following were reviewed and entered/updated in epic: Past Medical History  Diagnosis Date  . Asthma   . Allergy   . Hyperlipidemia   . Fibroid   . Hidradenitis   . Chicken pox   . Chronic tension headaches    Patient Active Problem List   Diagnosis Date Noted  . Hyperlipidemia 07/09/2010    Priority: Medium  . Asthma 07/09/2010    Priority: Medium  . Food allergy 01/20/2015    Priority: Low  . Allergic rhinitis 07/09/2010    Priority: Low  . Hidradenitis suppurativa 07/09/2010    Priority: Low  . Eczema 07/09/2010    Priority: Low   Past Surgical History  Procedure Laterality Date  . None      Family History  Problem Relation Age of Onset  . Hypertension Mother   . Diabetes Sister   . Obesity Sister   . Migraines Sister   . Asthma Sister   . Cancer Maternal Grandmother     cervical- lack of screening  . Cancer Maternal Grandfather     prostate  . Diabetes Maternal Grandfather   . Rheum arthritis Maternal Grandfather   . Liver cancer      maternal uncle- alcohol    Medications- reviewed and updated Current Outpatient Prescriptions  Medication Sig Dispense Refill  . beclomethasone (QVAR) 40 MCG/ACT inhaler Inhale 1 puff into the lungs 2 (two) times daily.    . cetirizine (ZYRTEC) 10 MG tablet Take 10 mg by mouth daily.    . clindamycin (CLEOCIN T) 1 % external solution Apply 1 application topically 2 (two) times daily.    . Prenatal Vit-Fe Fumarate-FA (M-VIT PO) Take by mouth.     No current facility-administered medications for this visit.    Allergies-reviewed and updated Allergies  Allergen Reactions  . Iodine     Shellfish allergy caused anaphalaxis  . Other     Nuts in testing but has not had a reaction    Social History   Social History  . Marital Status: Married    Spouse Name:  N/A  . Number of Children: N/A  . Years of Education: N/A   Social History Main Topics  . Smoking status: Never Smoker   . Smokeless tobacco: Never Used  . Alcohol Use: 0.0 oz/week    0 Standard drinks or equivalent per week     Comment: rare glass of wine every 2 weeks  . Drug Use: No  . Sexual Activity: Yes    Birth Control/ Protection: Pill   Other Topics Concern  . None   Social History Narrative   Family: Married (husband not seen here). almost 47 year old and 36 year old in 2016.    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 Negative for fever, chills, weight loss and diaphoresis.  HENT: Negative for ear pain, hearing loss and tinnitus.   Eyes: Negative for blurred vision, double vision and pain.  Respiratory: Negative for cough and hemoptysis.   Cardiovascular: Negative for chest pain, palpitations and leg swelling.  Gastrointestinal: Negative for heartburn, vomiting, abdominal  pain, diarrhea and constipation.  Genitourinary: Negative for dysuria and urgency.  Musculoskeletal: Negative for myalgias, back pain and neck pain.  Skin: Positive for itching and rash.  Neurological: Positive for headaches. Negative for dizziness, speech change, focal weakness, loss of consciousness and weakness.  Endo/Heme/Allergies: Negative for environmental allergies. Does not bruise/bleed easily.  Psychiatric/Behavioral: Negative for depression and hallucinations. The patient does not have insomnia.      Objective: BP 100/64 mmHg  Pulse 92  Temp(Src) 98.2 F (36.8 C)  Wt 182 lb (82.555 kg) Gen: NAD, resting comfortably HEENT: Mucous membranes are moist. Oropharynx normal. TM normal. Eyes: sclera and lids normal,  PERRLA Neck: no thyromegaly, no cervical lymphadenopathy CV: RRR no murmurs rubs or gallops Lungs: CTAB no crackles, wheeze, rhonchi Abdomen: soft/nontender/nondistended/normal bowel sounds. No rebound or guarding.  Ext: no edema Skin: warm, dry, hyperpigmentation at antecubital fossa bilaterally but no scale Neuro: 5/5 strength in upper and lower extremities, normal gait, normal reflexes  Assessment/Plan:  Constellation of symptoms (allergy issues including angioedema to milk products seemingly, skin changes, fatigue, feeling cold, weight gain) S:For several months patient has had a grouping of symptoms including intermittent hives,  angioedema lip and tongue swelling, nausea. Had epi pen. May have had peanuts in it. Pizza week later- swelling around eyes and face confirmed likely milk. Wide array of Allergies- nuts, shellfish, legumes, plums. Has avoided milk since that time and had cut back prior due to some dietary changes. She also has had worsening of eczema including eczema like behavior around her eyes. She has also noted Weight up in last 2-3 years despite walking, kick boxing. Some sweet tooth- has cut down and weight still seemed to increase. Some decrease in weight since paleo diet. Even before shifting food, noted feeling fatigued and always feeling cold.   In response to changes, Has cut dairy. Paleo type diet recently. Bought humidifier. Cleared out all clutter. Already has hardwood floors. Has noted imporovement since these changes seemingly the most with the paleo diet. Patient has been seeking a common link between these issues- questions thyroid or autoimmune process and states has discussed with Dr. Orvil Feil of allergy/asthma.   A/P: 19F with constellation of symptoms of multiple potential combined or not combined etiology. Will start with lab workup to include TSH, t3, t4, celiac, ana, rf, a1c. 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 Continue to follow with allergy especially in regards to finding etiology for angioedema  Return precautions advised. We discussed yearly CPE or prn if labs unrevealing and no new symptoms   Orders Placed This Encounter  Procedures  . Tissue transglutaminase, IgA  . Comprehensive metabolic panel    [place]  . CBC with Differential/Platelet  . Hemoglobin A1c    [place]  . TSH    [place]  . T4, free    [place]  . T3, Free  . ANA  . Rheumatoid Factor    Meds ordered this encounter  Medications  . Prenatal Vit-Fe Fumarate-FA (M-VIT PO)    Sig: Take by mouth.  . beclomethasone (QVAR) 40 MCG/ACT inhaler    Sig: Inhale 1 puff into the lungs 2 (two) times daily.  . cetirizine (ZYRTEC) 10 MG tablet    Sig: Take 10 mg by mouth daily.  . clindamycin (CLEOCIN T) 1 % external solution    Sig: Apply 1 application topically 2 (two) times daily.  . Norgestimate-Ethinyl  Estradiol Triphasic (ORTHO TRI-CYCLEN LO) 0.18/0.215/0.25 MG-25 MCG tab    Sig: Take 1 tablet by mouth daily.

## 2015-01-20 NOTE — Patient Instructions (Addendum)
Sign release of information at the front desk for records from GYN for pap smear and last lipid panel  Labs before you go- see if we can find anything to tie all this together (allergy issues, skin changes, fatigue, feeling cold, weight gain). I would be very open to adding anything else on that Dr. Leonie Green may suggest when you see her but I am glad the paleo diet is working though- continue

## 2015-01-21 LAB — ANTI-NUCLEAR AB-TITER (ANA TITER)

## 2015-01-21 LAB — TISSUE TRANSGLUTAMINASE, IGA: TISSUE TRANSGLUTAMINASE AB, IGA: 1 U/mL (ref <4)

## 2015-01-21 LAB — ANA: Anti Nuclear Antibody(ANA): POSITIVE — AB

## 2015-01-22 NOTE — Addendum Note (Signed)
Addended by: Clyde Lundborg A on: 01/22/2015 10:51 AM   Modules accepted: Orders

## 2015-02-17 ENCOUNTER — Telehealth: Payer: Self-pay | Admitting: Family Medicine

## 2015-02-17 NOTE — Telephone Encounter (Signed)
Tammy Rose see below, can you please try and get pt in at another location?

## 2015-02-17 NOTE — Telephone Encounter (Signed)
See below

## 2015-02-17 NOTE — Telephone Encounter (Signed)
You can have Ms. Neoma Laming check with other locations to see if they can get her in but may be tight this late in year to get her in

## 2015-02-17 NOTE — Telephone Encounter (Signed)
Pt was scheduled for an appt with a Rheumatologist a month ago for today. When she arrived to the office she was told the doctor was there, she has Laryngitis and they don't know when she'll return. 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 Please give her a call regarding this.  Pt's ph# [phone] Thank you.

## 2016-08-09 LAB — HEPATIC FUNCTION PANEL
ALT: 30 U/L (ref 7–35)
AST: 27 U/L (ref 13–35)
Alkaline Phosphatase: 88 U/L (ref 25–125)
Bilirubin, Total: 0.4 mg/dL

## 2016-08-09 LAB — CBC AND DIFFERENTIAL
HEMATOCRIT: 38 % (ref 36–46)
HEMOGLOBIN: 12.2 g/dL (ref 12.0–16.0)
Platelets: 294 10*3/uL (ref 150–399)
WBC: 4 10*3/mL

## 2016-08-09 LAB — BASIC METABOLIC PANEL
BUN: 8 mg/dL (ref 4–21)
CREATININE: 0.8 mg/dL (ref 0.5–1.1)
GLUCOSE: 86 mg/dL
POTASSIUM: 5.2 mmol/L (ref 3.4–5.3)
Sodium: 140 mmol/L (ref 137–147)

## 2016-08-09 LAB — POCT ERYTHROCYTE SEDIMENTATION RATE, NON-AUTOMATED: SED RATE: 38 mm

## 2016-08-10 ENCOUNTER — Encounter: Payer: Self-pay | Admitting: Family Medicine

## 2017-11-29 ENCOUNTER — Ambulatory Visit: Payer: Self-pay

## 2019-01-03 ENCOUNTER — Ambulatory Visit: Payer: Self-pay

## 2019-05-18 ENCOUNTER — Ambulatory Visit: Payer: Self-pay | Attending: Internal Medicine

## 2019-05-18 DIAGNOSIS — Z23 Encounter for immunization: Secondary | ICD-10-CM

## 2019-05-18 NOTE — Progress Notes (Signed)
   Covid-19 Vaccination Clinic  Name:  Tammy Rose    MRN: [id number] DOB: 02-22-1981  05/18/2019  Ms. Manalang was observed post Covid-19 immunization for 35min without incident. She was provided with Vaccine Information Sheet and instruction to access the V-Safe system.   Ms. Litterio was instructed to call 911 with any severe reactions post vaccine: Marland Kitchen Difficulty breathing  . Swelling of face and throat  . A fast heartbeat  . A bad rash all over body  . Dizziness and weakness   Immunizations Administered    Name Date Dose VIS Date Route   Pfizer COVID-19 Vaccine 05/18/2019 12:43 PM 0.3 mL 02/16/2019 Intramuscular   Manufacturer: [place]   Lot: [id number]   [place]: [id number]

## 2019-06-13 ENCOUNTER — Ambulatory Visit: Payer: Self-pay | Attending: Internal Medicine

## 2019-06-13 DIAGNOSIS — Z23 Encounter for immunization: Secondary | ICD-10-CM

## 2019-06-13 NOTE — Progress Notes (Signed)
   Covid-19 Vaccination Clinic  Name:  Dreyah Minkler    MRN: [id number] DOB: August 18, 1980  06/13/2019  Ms. Brazzle was observed post Covid-19 immunization for 30 minutes based on pre-vaccination screening without incident. She was provided with Vaccine Information Sheet and instruction to access the V-Safe system.   Ms. Marcoux was instructed to call 911 with any severe reactions post vaccine: Marland Kitchen Difficulty breathing  . Swelling of face and throat  . A fast heartbeat  . A bad rash all over body  . Dizziness and weakness   Immunizations Administered    Name Date Dose VIS Date Route   Pfizer COVID-19 Vaccine 06/13/2019 10:07 AM 0.3 mL 02/16/2019 Intramuscular   Manufacturer: [employer]   Lot: [id number]   [place]: [id number]

## 2019-12-26 ENCOUNTER — Other Ambulatory Visit: Payer: Self-pay | Admitting: Obstetrics and Gynecology

## 2019-12-26 DIAGNOSIS — R921 Mammographic calcification found on diagnostic imaging of breast: Secondary | ICD-10-CM

## 2020-01-16 ENCOUNTER — Ambulatory Visit
Admission: RE | Admit: 2020-01-16 | Discharge: 2020-01-16 | Disposition: A | Discharge status: Home / Self Care | Payer: BC Managed Care – PPO | Source: Ambulatory Visit | Attending: Obstetrics and Gynecology | Admitting: Obstetrics and Gynecology

## 2020-01-16 ENCOUNTER — Other Ambulatory Visit: Payer: Self-pay

## 2020-01-16 ENCOUNTER — Other Ambulatory Visit: Payer: Self-pay | Admitting: Obstetrics and Gynecology

## 2020-01-16 DIAGNOSIS — R921 Mammographic calcification found on diagnostic imaging of breast: Secondary | ICD-10-CM

## 2020-01-25 ENCOUNTER — Other Ambulatory Visit: Payer: Self-pay

## 2020-01-25 ENCOUNTER — Ambulatory Visit
Admission: RE | Admit: 2020-01-25 | Discharge: 2020-01-25 | Disposition: A | Discharge status: Home / Self Care | Payer: BC Managed Care – PPO | Source: Ambulatory Visit | Attending: Obstetrics and Gynecology | Admitting: Obstetrics and Gynecology

## 2020-01-25 ENCOUNTER — Other Ambulatory Visit: Payer: Self-pay | Admitting: Obstetrics and Gynecology

## 2020-01-25 DIAGNOSIS — R921 Mammographic calcification found on diagnostic imaging of breast: Secondary | ICD-10-CM

## 2022-04-05 IMAGING — MG MM BREAST BX W LOC DEV 1ST LESION IMAGE BX SPEC STEREO GUIDE*L*
8 of 9 series · 8 of 17 positions shown · non-contrast
Comparison: Previous exams.
COMPARISON: Previous exams.

Addendum:
CLINICAL DATA: 39-year-old female presenting for biopsy of left
breast calcifications.

EXAM:
LEFT BREAST STEREOTACTIC CORE NEEDLE BIOPSY

[L (1 of 6)]
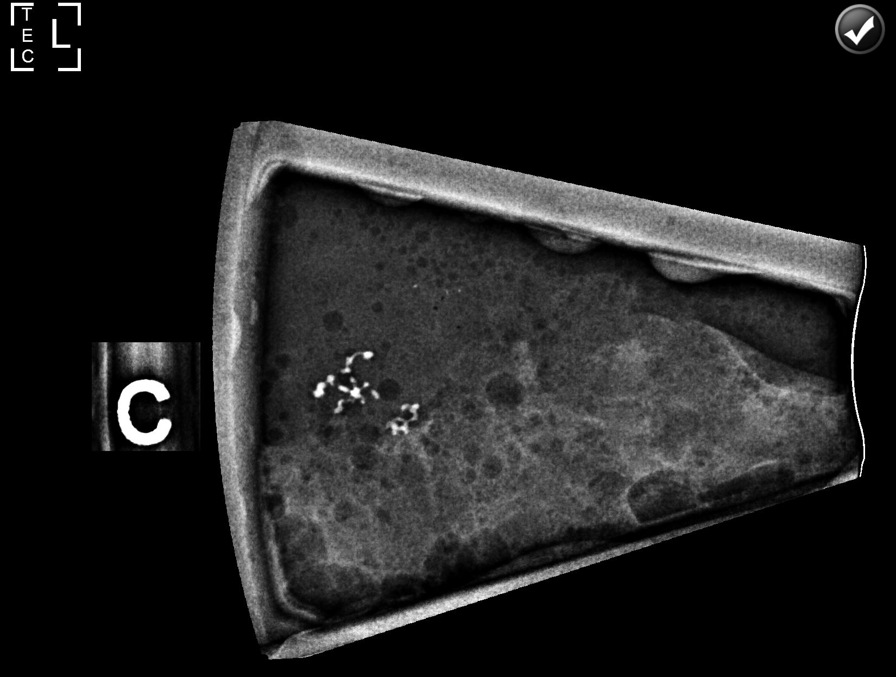

[L (2 of 6)]
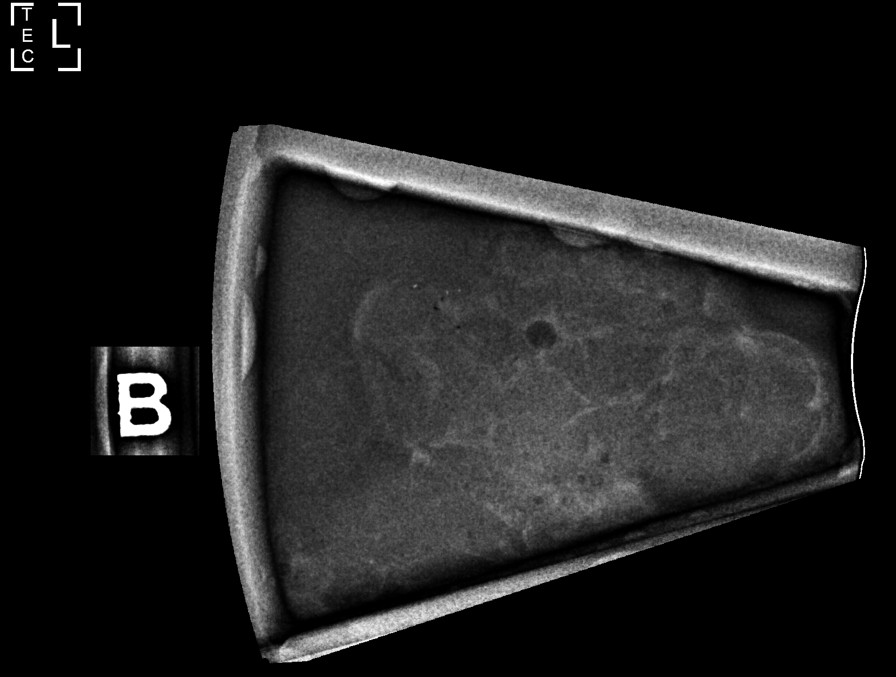

[L (3 of 6)]
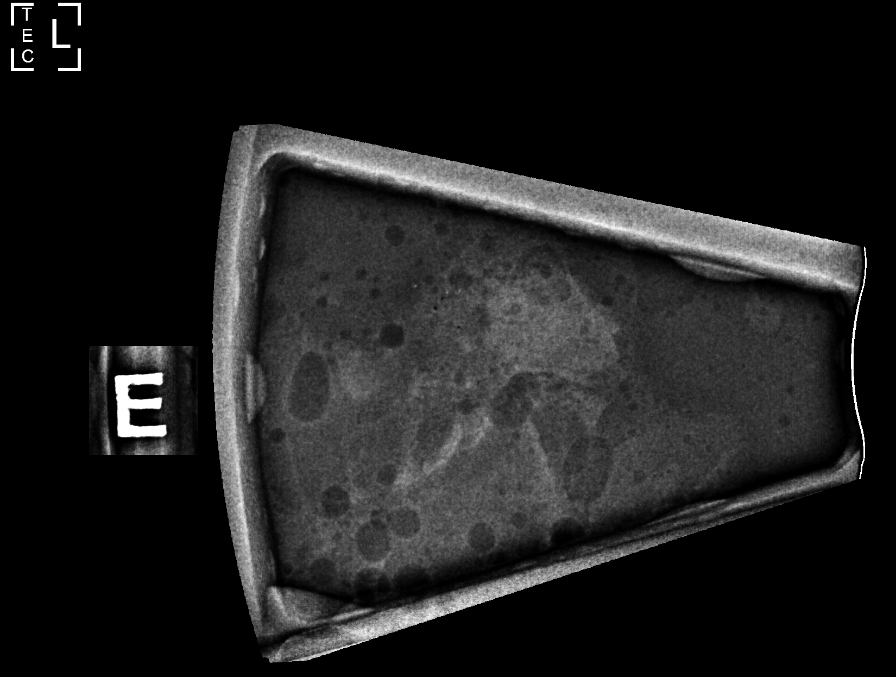

[L (4 of 6)]
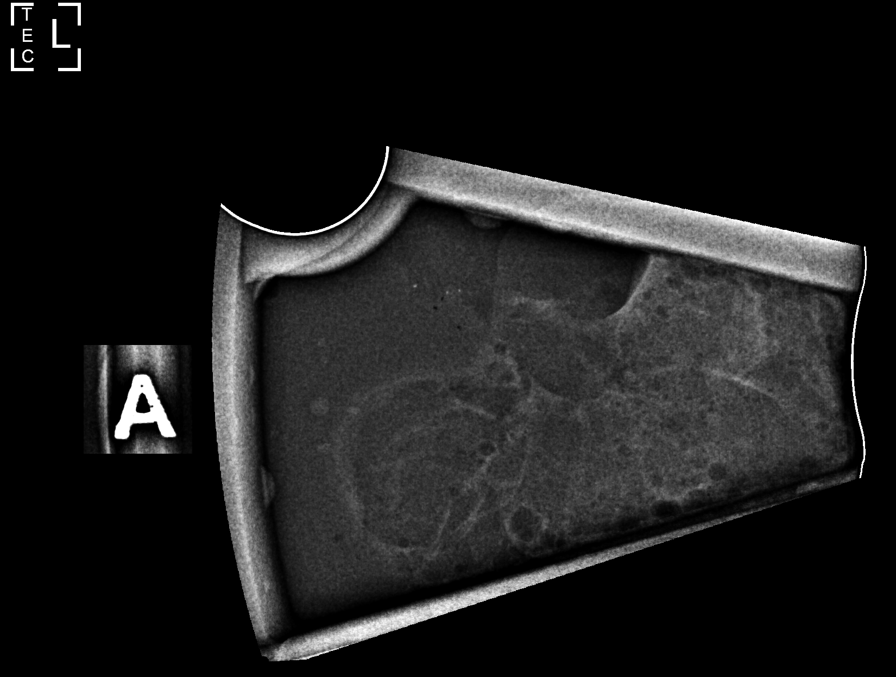

[L (5 of 6)]
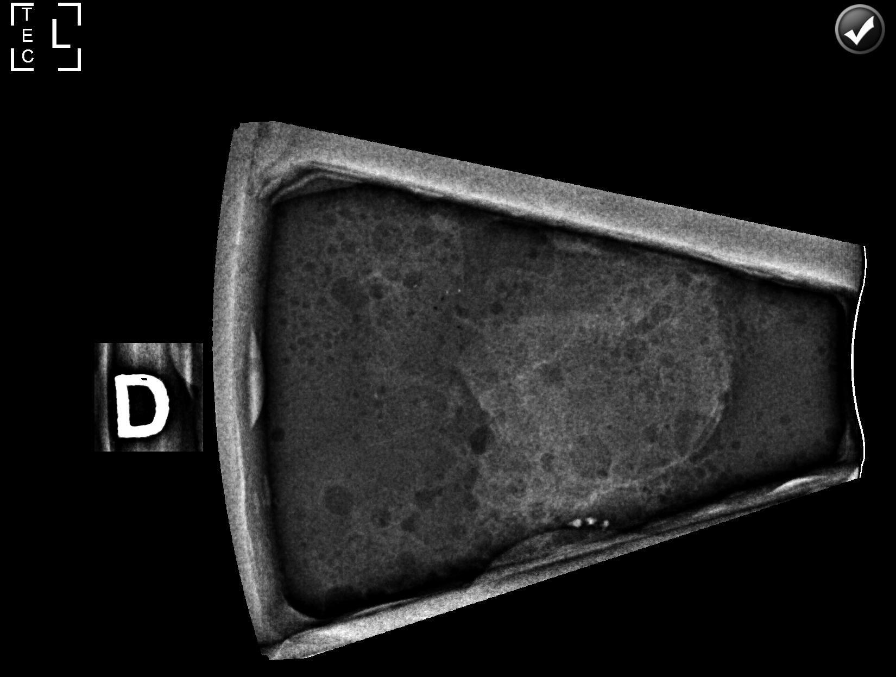

[L (6 of 6)]
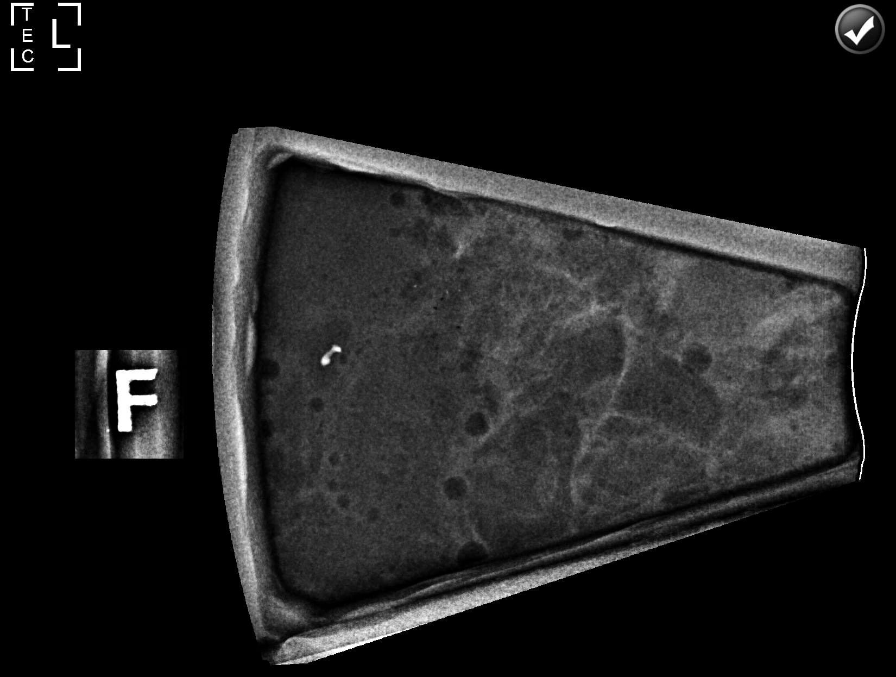

[L LM]
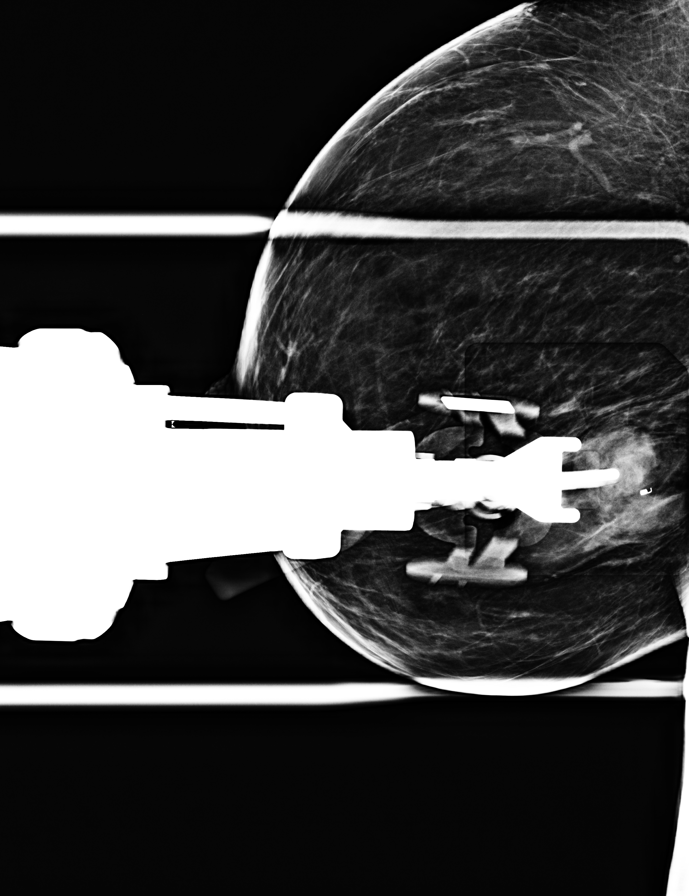

[L LM tomo · tomo slice 26/51.0]
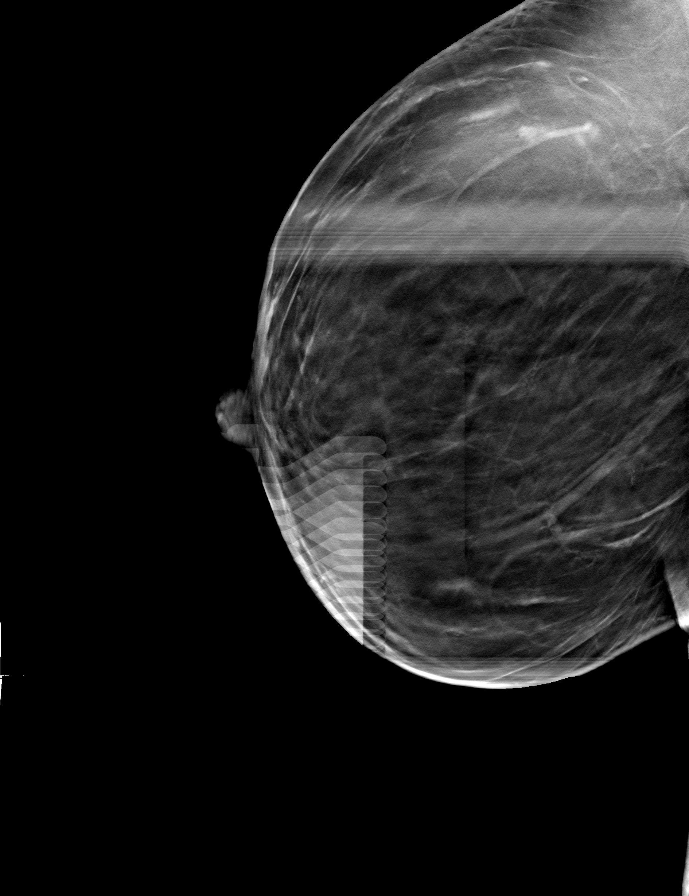

[8 of 17 positions shown; findings below may reference images not displayed]



Using sterile technique and 1% Lidocaine as local anesthetic, under
stereotactic guidance, a 9 gauge vacuum assisted device was used to
perform core needle biopsy of calcifications in the lower outer left
breast using a lateral approach. Specimen radiograph was performed
showing at least 3 specimens with calcifications. Specimens with
calcifications are identified for pathology.

Lesion quadrant: Lower outer quadrant

At the conclusion of the procedure, a coil tissue marker clip was
deployed into the biopsy cavity. Follow-up 2-view mammogram was
performed and dictated separately.

The patient had a large post biopsy hematoma. Additional compression
was held after the procedure until the bleeding completely stop. The
patient was given instructions for home care and will be contacted
by our nurse navigator on [REDACTED].
IMPRESSION: Stereotactic-guided biopsy of calcifications in the lower outer left
breast. Large post biopsy hematoma.

ADDENDUM:
Pathology revealed BENIGN BREAST TISSUE WITH MICROCALCIFICATIONS of
the Left breast, lower outer quadrant. This was found to be
concordant by Dr. Armani Wieczorek.

Pathology results were discussed with the patient by telephone. The
patient reported doing well after the biopsy with tenderness at the
site. Post biopsy instructions and care were reviewed and questions
were answered. The patient was encouraged to call The [REDACTED] for any additional concerns. My direct phone
number was provided.

The patient was instructed to return for annual screening
mammography at [HOSP[facility] in [HOSPITAL][HOSPITAL].

Pathology results reported by Caleb Aujla, RN on 01/30/2020.



Using sterile technique and 1% Lidocaine as local anesthetic, under
stereotactic guidance, a 9 gauge vacuum assisted device was used to
perform core needle biopsy of calcifications in the lower outer left
breast using a lateral approach. Specimen radiograph was performed
showing at least 3 specimens with calcifications. Specimens with
calcifications are identified for pathology.

Lesion quadrant: Lower outer quadrant

At the conclusion of the procedure, a coil tissue marker clip was
deployed into the biopsy cavity. Follow-up 2-view mammogram was
performed and dictated separately.

The patient had a large post biopsy hematoma. Additional compression
was held after the procedure until the bleeding completely stop. The
patient was given instructions for home care and will be contacted
by our nurse navigator on [REDACTED].
IMPRESSION: Stereotactic-guided biopsy of calcifications in the lower outer left
breast. Large post biopsy hematoma.
